# Patient Record
Sex: Male | Born: 1935 | Race: White | Hispanic: No | Smoking: Former smoker
Health system: Southern US, Community
[De-identification: ages and names within clinical notes are randomized; demographics above are authoritative.]

## PROBLEM LIST (undated history)

## (undated) DIAGNOSIS — K219 Gastro-esophageal reflux disease without esophagitis: Secondary | ICD-10-CM

## (undated) DIAGNOSIS — I10 Essential (primary) hypertension: Secondary | ICD-10-CM

## (undated) DIAGNOSIS — H9190 Unspecified hearing loss, unspecified ear: Secondary | ICD-10-CM

## (undated) HISTORY — PX: ERCP: SHX60

## (undated) HISTORY — PX: HERNIA REPAIR: SHX51

---

## 2003-03-03 HISTORY — PX: CHOLECYSTECTOMY: SHX55

## 2012-02-09 ENCOUNTER — Encounter (HOSPITAL_COMMUNITY): Payer: Self-pay | Admitting: Pharmacy Technician

## 2012-02-09 ENCOUNTER — Other Ambulatory Visit (INDEPENDENT_AMBULATORY_CARE_PROVIDER_SITE_OTHER): Payer: Self-pay | Admitting: *Deleted

## 2012-02-09 DIAGNOSIS — R109 Unspecified abdominal pain: Secondary | ICD-10-CM

## 2012-02-09 DIAGNOSIS — K8050 Calculus of bile duct without cholangitis or cholecystitis without obstruction: Secondary | ICD-10-CM

## 2012-02-10 ENCOUNTER — Encounter (HOSPITAL_COMMUNITY): Payer: Self-pay

## 2012-02-10 ENCOUNTER — Encounter (HOSPITAL_COMMUNITY)
Admission: RE | Admit: 2012-02-10 | Discharge: 2012-02-10 | Disposition: A | Discharge status: Home / Self Care | Payer: Medicare Other | Source: Ambulatory Visit | Attending: Internal Medicine | Admitting: Internal Medicine

## 2012-02-10 VITALS — BP 116/53 | HR 62 | Temp 97.8°F | Resp 18 | Ht 72.0 in | Wt 193.4 lb

## 2012-02-10 DIAGNOSIS — R109 Unspecified abdominal pain: Secondary | ICD-10-CM

## 2012-02-10 DIAGNOSIS — K8050 Calculus of bile duct without cholangitis or cholecystitis without obstruction: Secondary | ICD-10-CM

## 2012-02-10 HISTORY — DX: Essential (primary) hypertension: I10

## 2012-02-10 HISTORY — DX: Gastro-esophageal reflux disease without esophagitis: K21.9

## 2012-02-10 HISTORY — DX: Unspecified hearing loss, unspecified ear: H91.90

## 2012-02-10 LAB — BASIC METABOLIC PANEL
Chloride: 106 mEq/L (ref 96–112)
Creatinine, Ser: 1.09 mg/dL (ref 0.50–1.35)
GFR calc Af Amer: 70 mL/min — ABNORMAL LOW (ref >90)
Potassium: 4.8 mEq/L (ref 3.5–5.1)
Sodium: 141 mEq/L (ref 135–145)

## 2012-02-10 LAB — HEMOGLOBIN AND HEMATOCRIT, BLOOD
HCT: 40.5 % (ref 39.0–52.0)
Hemoglobin: 13.9 g/dL (ref 13.0–17.0)

## 2012-02-10 NOTE — Patient Instructions (Addendum)
20 John Knapp  02/10/2012   Your procedure is scheduled on: 02/12/2012  Report to [facility] at  755  AM.  Call this number if you have problems the morning of surgery: [phone]   Remember:   Do not eat food:After Midnight.  May have clear liquids:until Midnight .    Take these medicines the morning of surgery with A SIP OF WATER: benazepril,lanoxin,nexium   Do not wear jewelry, make-up or nail polish.  Do not wear lotions, powders, or perfumes.   Do not shave 48 hours prior to surgery. Men may shave face and neck.  Do not bring valuables to the hospital.  Contacts, dentures or bridgework may not be worn into surgery.  Leave suitcase in the car. After surgery it may be brought to your room.  For patients admitted to the hospital, checkout time is 11:00 AM the day of discharge.   Patients discharged the day of surgery will not be allowed to drive home.  Name and phone number of your driver: family  Special Instructions: N/A   Please read over the following fact sheets that you were given: Pain Booklet, Coughing and Deep Breathing, MRSA Information, Surgical Site Infection Prevention, Anesthesia Post-op Instructions and Care and Recovery After Surgery  Endoscopic Retrograde Cholangiopancreatography This is a test used to evaluate people with jaundice (a condition in which the person's skin is turning yellow because of the high level of bilirubin in your blood). Bilirubin is a product of the blood which is elevated when an obstruction in the bile duct occurs. The bile ducts are the pipe-like system which carry the bile from the liver to the gallbladder and out into your small bowel. In this test, a fiber optic endoscope (a small pencil-sized telescope) is inserted and a catheter is put into ducts for examination. This test can reveal stones, strictures, cysts, tumors, and other irregularities within the pancreatic ducts and bile ducts. PREPARATION FOR TEST Do not eat or drink  after midnight the day before the test.  NORMAL FINDINGS Normal size of biliary and pancreatic ducts. No obstruction or filling defects within the biliary or pancreatic ducts. Ranges for normal findings may vary among different laboratories and hospitals. You should always check with your doctor after having lab work or other tests done to discuss the meaning of your test results and whether your values are considered within normal limits. MEANING OF TEST  Your caregiver will go over the test results with you and discuss the importance and meaning of your results, as well as treatment options and the need for additional tests if necessary. OBTAINING THE TEST RESULTS  It is your responsibility to obtain your test results. Ask the lab or department performing the test when and how you will get your results. Document Released: 06/12/2004 Document Revised: 10/29/2010 Document Reviewed: 01/27/2008 [facility] Patient Information 2012 [place], [place].PATIENT INSTRUCTIONS POST-ANESTHESIA  IMMEDIATELY FOLLOWING SURGERY:  Do not drive or operate machinery for the first twenty four hours after surgery.  Do not make any important decisions for twenty four hours after surgery or while taking narcotic pain medications or sedatives.  If you develop intractable nausea and vomiting or a severe headache please notify your doctor immediately.  FOLLOW-UP:  Please make an appointment with your surgeon as instructed. You do not need to follow up with anesthesia unless specifically instructed to do so.  WOUND CARE INSTRUCTIONS (if applicable):  Keep a dry clean dressing on the anesthesia/puncture wound site if there is  drainage.  Once the wound has quit draining you may leave it open to air.  Generally you should leave the bandage intact for twenty four hours unless there is drainage.  If the epidural site drains for more than 36-48 hours please call the anesthesia department.  QUESTIONS?:  Please feel free to call your  physician or the hospital operator if you have any questions, and they will be happy to assist you.

## 2012-02-11 ENCOUNTER — Encounter (INDEPENDENT_AMBULATORY_CARE_PROVIDER_SITE_OTHER): Payer: Self-pay

## 2012-02-12 ENCOUNTER — Ambulatory Visit (HOSPITAL_COMMUNITY): Payer: Medicare Other

## 2012-02-12 ENCOUNTER — Ambulatory Visit (HOSPITAL_COMMUNITY)
Admission: RE | Admit: 2012-02-12 | Discharge: 2012-02-12 | Disposition: A | Discharge status: Home / Self Care | Payer: Medicare Other | Source: Ambulatory Visit | Attending: Internal Medicine | Admitting: Internal Medicine

## 2012-02-12 ENCOUNTER — Encounter (HOSPITAL_COMMUNITY): Payer: Self-pay | Admitting: Anesthesiology

## 2012-02-12 ENCOUNTER — Encounter (HOSPITAL_COMMUNITY): Payer: Self-pay | Admitting: *Deleted

## 2012-02-12 ENCOUNTER — Encounter (HOSPITAL_COMMUNITY)
Admission: RE | Disposition: A | Discharge status: Home / Self Care | Payer: Self-pay | Source: Ambulatory Visit | Attending: Internal Medicine

## 2012-02-12 ENCOUNTER — Ambulatory Visit (HOSPITAL_COMMUNITY): Payer: Medicare Other | Admitting: Anesthesiology

## 2012-02-12 DIAGNOSIS — R112 Nausea with vomiting, unspecified: Secondary | ICD-10-CM | POA: Insufficient documentation

## 2012-02-12 DIAGNOSIS — Z01812 Encounter for preprocedural laboratory examination: Secondary | ICD-10-CM | POA: Insufficient documentation

## 2012-02-12 DIAGNOSIS — R7402 Elevation of levels of lactic acid dehydrogenase (LDH): Secondary | ICD-10-CM

## 2012-02-12 DIAGNOSIS — K838 Other specified diseases of biliary tract: Secondary | ICD-10-CM

## 2012-02-12 DIAGNOSIS — R740 Nonspecific elevation of levels of transaminase and lactic acid dehydrogenase [LDH]: Secondary | ICD-10-CM

## 2012-02-12 DIAGNOSIS — K8050 Calculus of bile duct without cholangitis or cholecystitis without obstruction: Secondary | ICD-10-CM

## 2012-02-12 DIAGNOSIS — I10 Essential (primary) hypertension: Secondary | ICD-10-CM | POA: Insufficient documentation

## 2012-02-12 DIAGNOSIS — R109 Unspecified abdominal pain: Secondary | ICD-10-CM

## 2012-02-12 DIAGNOSIS — R1013 Epigastric pain: Secondary | ICD-10-CM | POA: Insufficient documentation

## 2012-02-12 HISTORY — PX: ERCP: SHX5425

## 2012-02-12 SURGERY — ERCP, WITH INTERVENTION IF INDICATED
Anesthesia: General | Site: Esophagus

## 2012-02-12 MED ORDER — LACTATED RINGERS IV SOLN
INTRAVENOUS | Status: DC
  Administered 2012-02-12: 09:00:00 via INTRAVENOUS

## 2012-02-12 MED ORDER — LIDOCAINE HCL (PF) 1 % IJ SOLN
INTRAMUSCULAR | Status: AC
  Filled 2012-02-12: qty 5

## 2012-02-12 MED ORDER — ONDANSETRON HCL 4 MG/2ML IJ SOLN
INTRAMUSCULAR | Status: AC
  Filled 2012-02-12: qty 2

## 2012-02-12 MED ORDER — PROPOFOL 10 MG/ML IV EMUL
INTRAVENOUS | Status: AC
  Filled 2012-02-12: qty 20

## 2012-02-12 MED ORDER — ONDANSETRON HCL 4 MG/2ML IJ SOLN
INTRAMUSCULAR | Status: DC | PRN
  Administered 2012-02-12: 4 mg via INTRAVENOUS

## 2012-02-12 MED ORDER — SUCCINYLCHOLINE CHLORIDE 20 MG/ML IJ SOLN
INTRAMUSCULAR | Status: DC | PRN
  Administered 2012-02-12: 120 mg via INTRAVENOUS

## 2012-02-12 MED ORDER — BUTAMBEN-TETRACAINE-BENZOCAINE 2-2-14 % EX AERO
1.0000 | INHALATION_SPRAY | Freq: Once | CUTANEOUS | Status: AC
  Administered 2012-02-12: 1 via TOPICAL
  Filled 2012-02-12: qty 56

## 2012-02-12 MED ORDER — LIDOCAINE HCL (CARDIAC) 20 MG/ML IV SOLN
INTRAVENOUS | Status: DC | PRN
  Administered 2012-02-12: 100 mg via INTRAVENOUS

## 2012-02-12 MED ORDER — STERILE WATER FOR IRRIGATION IR SOLN
Status: DC | PRN
  Administered 2012-02-12: 12:00:00

## 2012-02-12 MED ORDER — FENTANYL CITRATE 0.05 MG/ML IJ SOLN: 25.0000 ug | INTRAMUSCULAR | Status: DC | PRN

## 2012-02-12 MED ORDER — GLYCOPYRROLATE 0.2 MG/ML IJ SOLN
0.2000 mg | Freq: Once | INTRAMUSCULAR | Status: AC
  Administered 2012-02-12: 0.2 mg via INTRAVENOUS

## 2012-02-12 MED ORDER — ONDANSETRON HCL 4 MG/2ML IJ SOLN: 4.0000 mg | Freq: Once | INTRAMUSCULAR | Status: DC | PRN

## 2012-02-12 MED ORDER — FENTANYL CITRATE 0.05 MG/ML IJ SOLN
INTRAMUSCULAR | Status: DC | PRN
  Administered 2012-02-12: 100 ug via INTRAVENOUS

## 2012-02-12 MED ORDER — SODIUM CHLORIDE 0.9 % IV SOLN
INTRAVENOUS | Status: DC | PRN
  Administered 2012-02-12: 12:00:00

## 2012-02-12 MED ORDER — LACTATED RINGERS IV SOLN
INTRAVENOUS | Status: DC | PRN
  Administered 2012-02-12: 11:00:00 via INTRAVENOUS

## 2012-02-12 MED ORDER — ONDANSETRON HCL 4 MG/2ML IJ SOLN
4.0000 mg | Freq: Once | INTRAMUSCULAR | Status: AC
  Administered 2012-02-12: 4 mg via INTRAVENOUS

## 2012-02-12 MED ORDER — GLUCAGON HCL (RDNA) 1 MG IJ SOLR
INTRAMUSCULAR | Status: DC | PRN
  Administered 2012-02-12: .25 mL via INTRAVENOUS
  Administered 2012-02-12: .75 mL via INTRAVENOUS

## 2012-02-12 MED ORDER — PROPOFOL 10 MG/ML IV BOLUS
INTRAVENOUS | Status: DC | PRN
  Administered 2012-02-12: 150 mg via INTRAVENOUS

## 2012-02-12 MED ORDER — FENTANYL CITRATE 0.05 MG/ML IJ SOLN
INTRAMUSCULAR | Status: AC
  Filled 2012-02-12: qty 2

## 2012-02-12 MED ORDER — SUCCINYLCHOLINE CHLORIDE 20 MG/ML IJ SOLN
INTRAMUSCULAR | Status: AC
  Filled 2012-02-12: qty 1

## 2012-02-12 MED ORDER — CEFAZOLIN SODIUM-DEXTROSE 2-3 GM-% IV SOLR
INTRAVENOUS | Status: AC
  Filled 2012-02-12: qty 50

## 2012-02-12 MED ORDER — CEFAZOLIN SODIUM-DEXTROSE 2-3 GM-% IV SOLR
2.0000 g | INTRAVENOUS | Status: AC
  Administered 2012-02-12: 2 g via INTRAVENOUS

## 2012-02-12 MED ORDER — MIDAZOLAM HCL 2 MG/2ML IJ SOLN
INTRAMUSCULAR | Status: AC
  Filled 2012-02-12: qty 2

## 2012-02-12 MED ORDER — URSODIOL 250 MG PO TABS: 250.0000 mg | ORAL_TABLET | Freq: Two times a day (BID) | ORAL | Status: DC

## 2012-02-12 MED ORDER — MIDAZOLAM HCL 2 MG/2ML IJ SOLN
1.0000 mg | INTRAMUSCULAR | Status: DC | PRN
  Administered 2012-02-12: 2 mg via INTRAVENOUS

## 2012-02-12 MED ORDER — GLYCOPYRROLATE 0.2 MG/ML IJ SOLN
INTRAMUSCULAR | Status: AC
  Filled 2012-02-12: qty 1

## 2012-02-12 SURGICAL SUPPLY — 24 items
BAG HAMPER (MISCELLANEOUS) ×3 IMPLANT
BALLN RETRIEVAL 12X15 (BALLOONS) ×3 IMPLANT
BASKET TRAPEZOID 3X6 (MISCELLANEOUS) ×3 IMPLANT
DEVICE INFLATION ENCORE 26 (MISCELLANEOUS) IMPLANT
DEVICE LOCKING W-BIOPSY CAP (MISCELLANEOUS) ×3 IMPLANT
GUIDEWIRE HYDRA JAGWIRE .35 (WIRE) IMPLANT
GUIDEWIRE JAG HINI 025X260CM (WIRE) IMPLANT
KIT ROOM TURNOVER APOR (KITS) ×3 IMPLANT
LUBRICANT JELLY 4.5OZ STERILE (MISCELLANEOUS) ×3 IMPLANT
NEEDLE HYPO 18GX1.5 BLUNT FILL (NEEDLE) IMPLANT
PAD ARMBOARD 7.5X6 YLW CONV (MISCELLANEOUS) ×6 IMPLANT
PATHFINDER 450CM 0.18 (STENTS) IMPLANT
POSITIONER HEAD 8X9X4 ADT (SOFTGOODS) IMPLANT
SNARE ROTATE MED OVAL 20MM (MISCELLANEOUS) IMPLANT
SPHINCTEROTOME AUTOTOME .25 (MISCELLANEOUS) ×6 IMPLANT
SPHINCTEROTOME HYDRATOME 44 (MISCELLANEOUS) ×3 IMPLANT
SPONGE GAUZE 4X4 12PLY (GAUZE/BANDAGES/DRESSINGS) ×3 IMPLANT
SYR 3ML LL SCALE MARK (SYRINGE) IMPLANT
SYR 50ML LL SCALE MARK (SYRINGE) ×6 IMPLANT
SYSTEM CONTINUOUS INJECTION (MISCELLANEOUS) ×3 IMPLANT
TUBING ENDO SMARTCAP PENTAX (MISCELLANEOUS) ×3 IMPLANT
WALLSTENT METAL COVERED 10X60 (STENTS) IMPLANT
WALLSTENT METAL COVERED 10X80 (STENTS) IMPLANT
WATER STERILE IRR 1000ML POUR (IV SOLUTION) ×6 IMPLANT

## 2012-02-12 NOTE — Brief Op Note (Signed)
02/12/2012  12:24 PM  PATIENT:  John Knapp  78 y.o. male  PRE-OPERATIVE DIAGNOSIS:  abdominal pain, choledocholithiasis  POST-OPERATIVE DIAGNOSIS:  Dilated Biliary System, No Stones  PROCEDURE:  Procedure(s) (LRB) with comments: ENDOSCOPIC RETROGRADE CHOLANGIOPANCREATOGRAPHY (ERCP) (N/A)  SURGEON:  Surgeon(s) and Role:    * Malissa Hippo, MD - Primary  Findings; Normal ampulla of Vater. Markedly dilated biliary system but no definite stones identified. CHD measured close to 20 mm. Sphincterotomy extended. Dormia basket and balloon stone extractor trolled through the bile duct no stones present. Patient tolerated procedure well.

## 2012-02-12 NOTE — Anesthesia Preprocedure Evaluation (Addendum)
Anesthesia Evaluation  Patient identified by MRN, date of birth, ID band Patient awake    Reviewed: Allergy & Precautions, H&P , NPO status , Patient's Chart, lab work & pertinent test results  Airway Mallampati: III  Neck ROM: Limited  Mouth opening: Limited Mouth Opening  Dental  (+) Teeth Intact and Dental Advisory Given   Pulmonary neg pulmonary ROS,    Pulmonary exam normal       Cardiovascular hypertension, Pt. on medications Rhythm:Irregular Rate:Normal     Neuro/Psych negative neurological ROS  negative psych ROS   GI/Hepatic Neg liver ROS, GERD-  Medicated and Controlled,  Endo/Other  negative endocrine ROS  Renal/GU negative Renal ROS  negative genitourinary   Musculoskeletal negative musculoskeletal ROS (+)   Abdominal Normal abdominal exam  (+)   Peds  Hematology negative hematology ROS (+)   Anesthesia Other Findings   Reproductive/Obstetrics                          Anesthesia Physical Anesthesia Plan  ASA: II  Anesthesia Plan: General   Post-op Pain Management:    Induction: Intravenous  Airway Management Planned: Video Laryngoscope Planned and Oral ETT  Additional Equipment:   Intra-op Plan:   Post-operative Plan: Extubation in OR  Informed Consent: I have reviewed the patients History and Physical, chart, labs and discussed the procedure including the risks, benefits and alternatives for the proposed anesthesia with the patient or authorized representative who has indicated his/her understanding and acceptance.     Plan Discussed with: CRNA  Anesthesia Plan Comments:         Anesthesia Quick Evaluation

## 2012-02-12 NOTE — H&P (Signed)
John Knapp is an 78 y.o. male.   Chief Complaint: Patient is here for ERCP. HPI: Patient is 78 year old Caucasian male who presents with 8 week history of intermittent epigastric pain nausea and vomiting. He was evaluated by his gastroenterologist Dr. Halina Maidens of [place], [place]. He underwent EGD which was negative for peptic ulcer disease. He's also undergone imaging studies. His LFTs were noted to be elevated. Has history of choledocholithiasis and had ERCP for a 5 years ago. Given his symptoms and the fact that he has elevated LFTs Dr. Aleene Davidson felt that he has recurrent choledocholithiasis and therefore referred him. Patient is accompanied by his wife today. He states he's been on very strict diet. He has not experienced any pain for the last 10 days. He has lost 10 pounds since her symptoms started. He denies fever or chills hematemesis melena or rectal bleeding. In addition to what is recorded under past medical history he has had multiple skin cancers removed from his face. He also has history of fatty liver. He has lost several pounds since his diagnosis was made.  Past Medical History  Diagnosis Date  . Hypertension   . GERD (gastroesophageal reflux disease)   . HOH (hard of hearing)     Past Surgical History  Procedure Date  . Hernia repair     bilateral inguinal hernia  . Cholecystectomy 2005    [facility]  . Ercp     History reviewed. No pertinent family history. Social History:  reports that he has quit smoking. His smoking use included Pipe and Cigars. He quit smokeless tobacco use about 18 years ago. He reports that he drinks about .6 ounces of alcohol per week. He reports that he does not use illicit drugs.  Allergies: No Known Allergies  Medications Prior to Admission  Medication Sig Dispense Refill  . benazepril (LOTENSIN) 20 MG tablet Take 20 mg by mouth daily.      . cholecalciferol (VITAMIN D) 1000 UNITS tablet Take 1,000 Units by mouth daily.       . digoxin (LANOXIN) 0.25 MG tablet Take 0.25 mg by mouth daily.      Marland Kitchen esomeprazole (NEXIUM) 40 MG capsule Take 40 mg by mouth daily.      . furosemide (LASIX) 20 MG tablet Take 20 mg by mouth every other day.      . Glucosamine-Chondroitin (GLUCOSAMINE CHONDROITIN COMPLX) 500-250 MG CAPS Take 1 capsule by mouth 3 (three) times daily.      . saw palmetto 500 MG capsule Take 500 mg by mouth 3 (three) times daily.      Marland Kitchen acetaminophen-codeine (TYLENOL #3) 300-30 MG per tablet Take 1 tablet by mouth every 4 (four) hours as needed. Pain      . dicyclomine (BENTYL) 20 MG tablet Take 20 mg by mouth daily as needed. Stomach pain      . sildenafil (VIAGRA) 50 MG tablet Take 50 mg by mouth daily as needed. Sexual Arousal        Results for orders placed during the hospital encounter of 02/10/12 (from the past 48 hour(s))  BASIC METABOLIC PANEL     Status: Abnormal   Collection Time   02/10/12  1:30 PM      Component Value Range Comment   Sodium 141  135 - 145 mEq/L    Potassium 4.8  3.5 - 5.1 mEq/L    Chloride 106  96 - 112 mEq/L    CO2 28  19 - 32 mEq/L  Glucose, Bld 101 (*) 70 - 99 mg/dL    BUN 13  6 - 23 mg/dL    Creatinine, Ser 6.96  0.50 - 1.35 mg/dL    Calcium 29.5  8.4 - 10.5 mg/dL    GFR calc non Af Amer 60 (*) >90 mL/min    GFR calc Af Amer 70 (*) >90 mL/min   HEMOGLOBIN AND HEMATOCRIT, BLOOD     Status: Normal   Collection Time   02/10/12  1:30 PM      Component Value Range Comment   Hemoglobin 13.9  13.0 - 17.0 g/dL    HCT 28.4  13.2 - 44.0 %    No results found.  ROS  Blood pressure 112/60, pulse 61, temperature 97.6 F (36.4 C), temperature source Oral, resp. rate 17, SpO2 93.00%. Physical Exam  Constitutional: He appears well-developed and well-nourished.  HENT:  Mouth/Throat: Oropharynx is clear and moist.  Eyes: Conjunctivae normal are normal. No scleral icterus.  Neck: No thyromegaly present.  Cardiovascular: Normal rate, regular rhythm and normal heart sounds.    No murmur heard. Respiratory: Effort normal and breath sounds normal.  GI: Soft. He exhibits no distension and no mass. There is no tenderness.  Musculoskeletal: He exhibits no edema.  Lymphadenopathy:    He has no cervical adenopathy.  Neurological: He is alert.  Skin: Skin is warm and dry.     Assessment/Plan Recurrent upper abdominal pain with elevated transaminases and history of choledocholithiasis. Suspect recurrent choledocholithiasis. ERCP with therapeutic intervention to  John Knapp U 02/12/2012, 10:50 AM

## 2012-02-12 NOTE — Preoperative (Signed)
Beta Blockers   Reason not to administer Beta Blockers:Not Applicable 

## 2012-02-12 NOTE — Addendum Note (Signed)
Addendum  created 02/12/12 1402 by Jeani Hawking, CRNA   Modules edited:Charges VN

## 2012-02-12 NOTE — Transfer of Care (Signed)
Immediate Anesthesia Transfer of Care Note  Patient: John Knapp  Procedure(s) Performed: Procedure(s) (LRB) with comments: ENDOSCOPIC RETROGRADE CHOLANGIOPANCREATOGRAPHY (ERCP) (N/A)  Patient Location: PACU  Anesthesia Type:General  Level of Consciousness: awake, alert  and oriented  Airway & Oxygen Therapy: Patient Spontanous Breathing and Patient connected to face mask oxygen  Post-op Assessment: Report given to PACU RN and Post -op Vital signs reviewed and stable  Post vital signs: Reviewed and stable  Complications: No apparent anesthesia complications

## 2012-02-12 NOTE — Anesthesia Postprocedure Evaluation (Signed)
  Anesthesia Post-op Note  Patient: John Knapp  Procedure(s) Performed: Procedure(s) (LRB) with comments: ENDOSCOPIC RETROGRADE CHOLANGIOPANCREATOGRAPHY (ERCP) (N/A)  Patient Location: PACU  Anesthesia Type:General  Level of Consciousness: awake, alert  and oriented  Airway and Oxygen Therapy: Patient Spontanous Breathing  Post-op Pain: none  Post-op Assessment: Post-op Vital signs reviewed, Patient's Cardiovascular Status Stable, Respiratory Function Stable, Patent Airway, No signs of Nausea or vomiting, Adequate PO intake, Pain level controlled, No headache, No backache, No residual numbness and No residual motor weakness  Post-op Vital Signs: Reviewed and stable  Complications: No apparent anesthesia complications

## 2012-02-13 NOTE — Op Note (Signed)
NAME:  John Knapp, KRAAI NO.:  [id number]  MEDICAL RECORD NO.:  [id number]  LOCATION:  [facility]                          FACILITY:  [facility]  PHYSICIAN:  Lionel December, M.D.    DATE OF BIRTH:  03/26/1925  DATE OF PROCEDURE:  02/12/2012 DATE OF DISCHARGE:  02/12/2012                              OPERATIVE REPORT   PROCEDURE:  ERCP with extension of biliary sphincterotomy.  INDICATION:  The patient is an 78 year old Caucasian male with recurrent epigastric pain, nausea, vomiting, with some elevated transaminases.  He has history of choledocholithiasis and has undergone one or possibly two ERCPs in the past.  His workup recently has been negative for peptic ulcer disease or other etiologies.  He was evaluated by Dr. Halina Maidens of [place], [place], and sent over for therapeutic ERCP.  Procedures were reviewed with the patient and his wife, and informed consent was obtained from him.  SEDATION/ANESTHESIA:  Please see anesthesia records for details.  FINDINGS:  Procedure was performed in the OR.  After general endotracheal anesthesia was induced, the patient was placed in semiprone position and therapeutic Pentax video duodenoscope was passed through oropharynx without any difficulty into the stomach and across the pylorus into bulb and descending duodenum.  Ampulla of Vater appeared to be normal.  CBD was easily cannulated using RX 44 Autotome and 0.035 Hydra Jagwire.  Biliary system was filled with dilute contrast.  CBD and CHD were markedly dilated and somewhat tortuous.  CHD was about 20 mm. No filling defects were identified.  Biliary sphincterotomy was extended.  Initially, Dormia basket was trolled through the common hepatic and bile duct couple of times, but there were no stones. Subsequently, stone balloon extractor was also passed through the bile duct with similar results.  I was able to pass 11-mm balloon across the sphincterotomy.  Pancreatic duct was  not studied.  The patient tolerated the procedure well.  FINAL DIAGNOSIS:  Markedly dilated biliary system without evidence of choledocholithiasis.  Suspect he may have microlithiasis and passing them intermittently.  Biliary sphincterotomy extended.  RECOMMENDATIONS:  Clear liquids today.  No aspirin or NSAIDs for 72 hours.  We will start him on Urso 250 mg p.o. b.i.d.  Should he experience another episodes, we will check his LFTs and go from there.  I would like to see him in the office in 3 months.          ______________________________ Lionel December, M.D.     NR/MEDQ  D:  02/12/2012  T:  02/13/2012  Job:  [id number]  cc:   Ree Kida B. Lorelle Gibbs., MD

## 2012-02-16 ENCOUNTER — Encounter (HOSPITAL_COMMUNITY): Payer: Self-pay | Admitting: Internal Medicine

## 2012-05-10 ENCOUNTER — Encounter (INDEPENDENT_AMBULATORY_CARE_PROVIDER_SITE_OTHER): Payer: Self-pay | Admitting: Internal Medicine

## 2012-05-10 ENCOUNTER — Ambulatory Visit (INDEPENDENT_AMBULATORY_CARE_PROVIDER_SITE_OTHER): Payer: Medicare Other | Admitting: Internal Medicine

## 2012-05-10 VITALS — BP 114/66 | HR 70 | Temp 97.0°F | Resp 18 | Ht 72.0 in | Wt 201.7 lb

## 2012-05-10 DIAGNOSIS — K8050 Calculus of bile duct without cholangitis or cholecystitis without obstruction: Secondary | ICD-10-CM | POA: Insufficient documentation

## 2012-05-10 DIAGNOSIS — I10 Essential (primary) hypertension: Secondary | ICD-10-CM | POA: Insufficient documentation

## 2012-05-10 NOTE — Patient Instructions (Signed)
Notify if you have right upper quadrant abdominal pain. Have Dr. Jonelle Sports check  LFTs with next blood draw and fax [place] a copy.

## 2012-05-10 NOTE — Progress Notes (Signed)
Presenting complaint;  Followup for biliary tract disease.  Subjective:  Patient is 79 year old Caucasian male who has history of choledocholithiasis with ERCP several years ago who presented with epigastric pain nausea elevated transaminases and biliary dilation. He underwent ERCP three months ago revealing dilated very system but no evidence of choledocholithiasis. Sphincterotomy was extended and he was begun on Urso. He he feels well. He has not had epigastric or right upper quadrant pain since his last examination. He remains with good appetite. He has prescription for dicyclomine which she uses sporadically when he has low abdominal pain. He has good appetite and his weight is stable. He is more concerned about his wife's headache and neck pain and he states he refuses to see a doctor.  Current Medications: Current Outpatient Prescriptions  Medication Sig Dispense Refill  . acetaminophen-codeine (TYLENOL #3) 300-30 MG per tablet Take 1 tablet by mouth every 4 (four) hours as needed. Pain      . benazepril (LOTENSIN) 20 MG tablet Take 20 mg by mouth daily.      . cholecalciferol (VITAMIN D) 1000 UNITS tablet Take 1,000 Units by mouth daily.      Marland Kitchen dicyclomine (BENTYL) 10 MG capsule Take 20 mg by mouth as needed.      . digoxin (LANOXIN) 0.25 MG tablet Take 0.25 mg by mouth daily.      Marland Kitchen esomeprazole (NEXIUM) 40 MG capsule Take 40 mg by mouth daily.      . fish oil-omega-3 fatty acids 1000 MG capsule Take 1 g by mouth. Patient states that he taked 1 by mouth every other day      . furosemide (LASIX) 20 MG tablet Take 20 mg by mouth daily.       . Glucosamine-Chondroitin (GLUCOSAMINE CHONDROITIN COMPLX) 500-250 MG CAPS Take 1 capsule by mouth 3 (three) times daily.      . Multiple Vitamins-Minerals (CENTRUM SILVER PO) Take by mouth daily.      . saw palmetto 500 MG capsule Take 500 mg by mouth 3 (three) times daily.      . sildenafil (VIAGRA) 50 MG tablet Take 50 mg by mouth daily as  needed. Sexual Arousal      . ursodiol (URSO) 250 MG tablet Take 1 tablet (250 mg total) by mouth 2 (two) times daily.  60 tablet  5   No current facility-administered medications for this visit.     Objective: Blood pressure 114/66, pulse 70, temperature 97 F (36.1 C), temperature source Oral, resp. rate 18, height 6' (1.829 m), weight 201 lb 11.2 oz (91.491 kg). Patient is alert and in no acute distress. Conjunctiva is pink. Sclera is nonicteric Oropharyngeal mucosa is normal. No neck masses or thyromegaly noted. Abdomen is soft and nontender without organomegaly or masses.  No LE edema or clubbing noted.   Assessment:  Recurrent biliary colic most likely secondary to microlithiasis. He has history of choledocholithiasis with remote ERCP. Recent ERCP three months ago revealed dilated biliary system without evidence of stones. Sphincterotomy was extended. He remains asymptomatic.   Plan:  Patient will continue Urso at current dose which is 250 mg by mouth twice a day. He will have LFTs on his next visit with Dr. Alita Chyle. Patient will call if abdominal pain recurs. Office visit in one year.

## 2012-09-22 ENCOUNTER — Other Ambulatory Visit (INDEPENDENT_AMBULATORY_CARE_PROVIDER_SITE_OTHER): Payer: Self-pay | Admitting: Internal Medicine

## 2012-09-22 MED ORDER — URSODIOL 250 MG PO TABS: 250.0000 mg | ORAL_TABLET | Freq: Two times a day (BID) | ORAL | Status: DC

## 2013-02-15 ENCOUNTER — Encounter (INDEPENDENT_AMBULATORY_CARE_PROVIDER_SITE_OTHER): Payer: Self-pay | Admitting: *Deleted

## 2013-03-06 IMAGING — CR DG ABD PORTABLE 1V
1 series · 1 of 1 positions shown · non-contrast
Comparison: None.

CLINICAL DATA: Choledocholithiasis.  Abdominal pain.

PORTABLE ABDOMEN - 1 VIEW

[view not recorded]
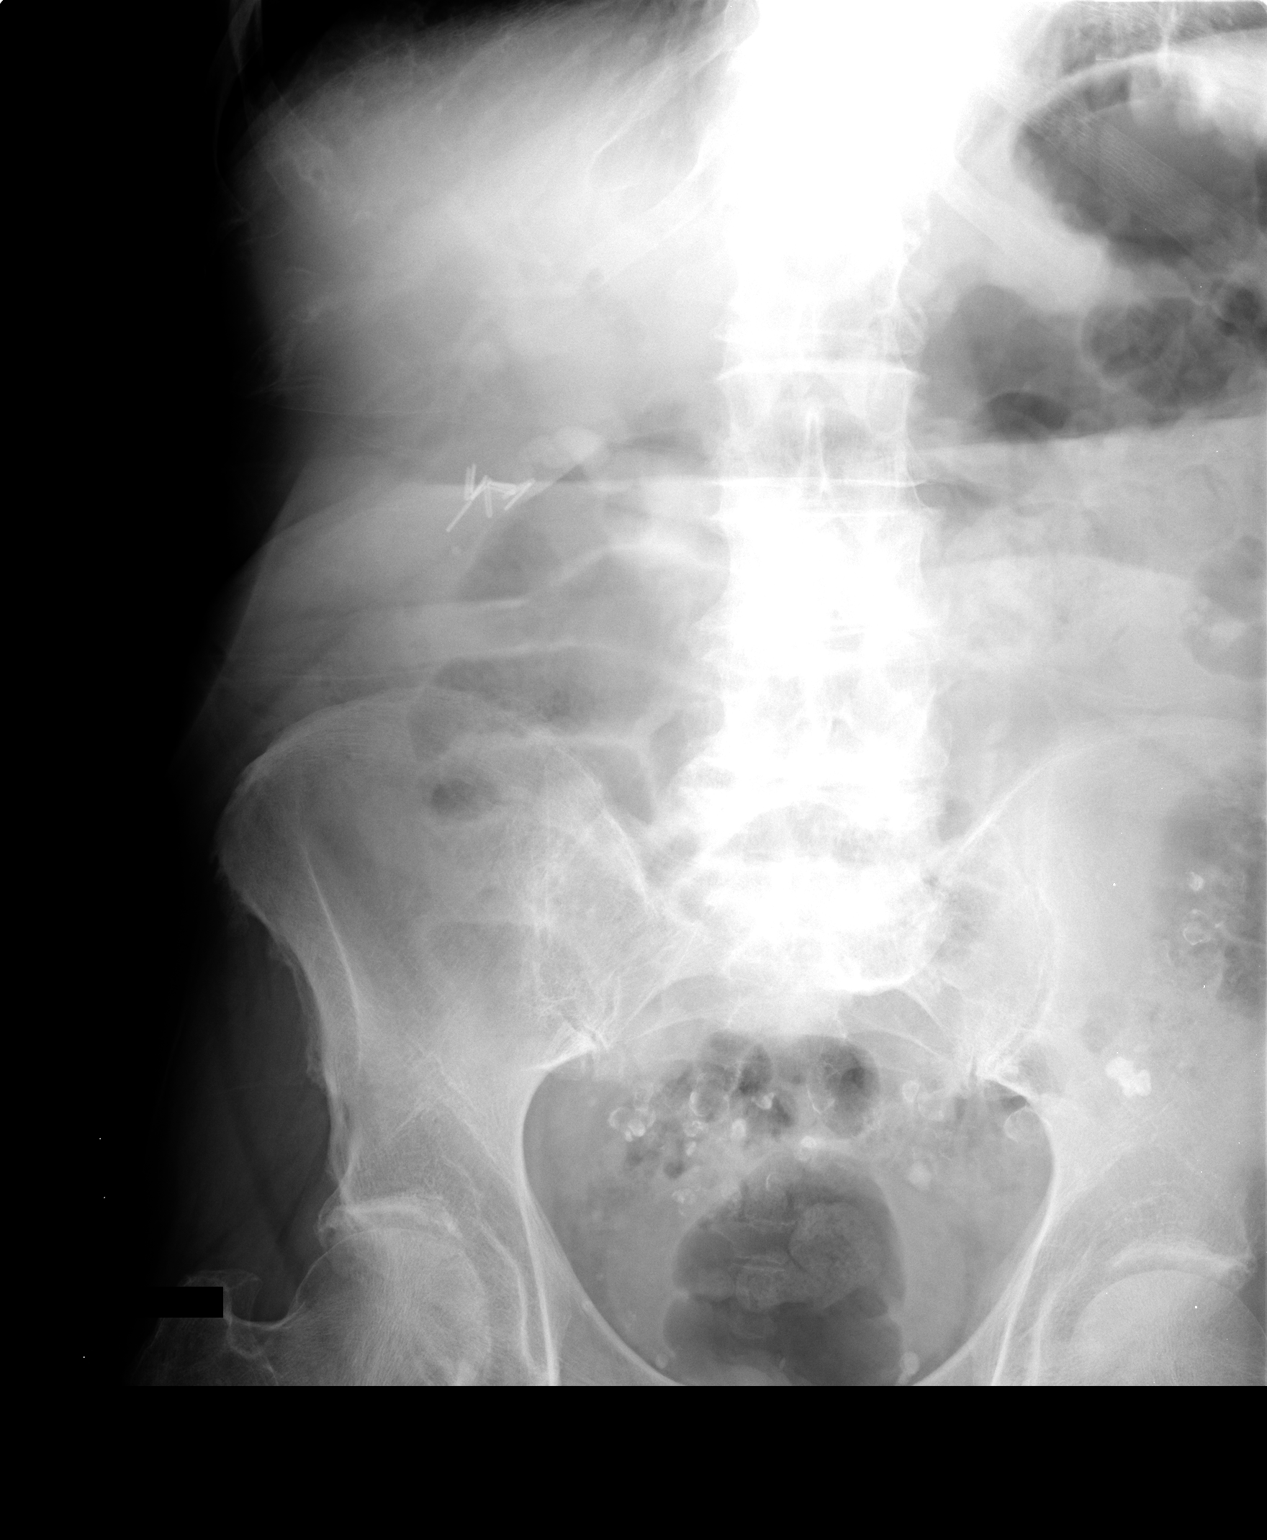

[1 of 1 positions shown; findings below may reference images not displayed]

FINDINGS: There is faint contrast in the slightly dilated biliary
tree.  Gallbladder has been removed.  No dilated bowel.

Barium is seen in multiple diverticula in the distal colon.
Degenerative changes throughout the lumbar spine.

No acute abnormalities.
IMPRESSION: Contrast seen in the dilated biliary tree.  Sigmoid diverticulosis.

## 2013-05-16 ENCOUNTER — Ambulatory Visit (INDEPENDENT_AMBULATORY_CARE_PROVIDER_SITE_OTHER): Payer: Medicare Other | Admitting: Internal Medicine

## 2013-05-16 ENCOUNTER — Encounter (INDEPENDENT_AMBULATORY_CARE_PROVIDER_SITE_OTHER): Payer: Self-pay | Admitting: Internal Medicine

## 2013-05-16 VITALS — BP 112/64 | HR 64 | Temp 97.7°F | Ht 72.0 in | Wt 204.6 lb

## 2013-05-16 DIAGNOSIS — R748 Abnormal levels of other serum enzymes: Secondary | ICD-10-CM

## 2013-05-16 NOTE — Progress Notes (Addendum)
Subjective:     Patient ID: John Knapp, male   DOB: 1925/08/01, 80 y.o.   MRN: [id number]  HPI Here today for one year follow up. In 2013 he underwent an ERCP for dilated CBD. He had presented with epigastric pain, nausea and elevated transaminases. There was no evidence of choledocholithiasis. He was begun on Urso. He was referred to our office by Dr. Halina Maidens of [place], [place]. He had undergone and EGD which was negative for PUD.  He tells me he is doing great. He is exercising by walking up and down stairs.   Appetite is good. No weight loss. There is not abdominal pain.  He has a BM twice a day. No melena or bright red rectal bleeding PCP is Dr. Burr Medico   ERCP in 01/2012 FINAL DIAGNOSIS: Markedly dilated biliary system without evidence of  choledocholithiasis.  Suspect he may have microlithiasis and passing them intermittently.  Biliary sphincterotomy extended.  RECOMMENDATIONS: Clear liquids today. No aspirin or NSAIDs for 72  hours.  04/2012    Review of Systems     Past Medical History  Diagnosis Date  . Hypertension   . GERD (gastroesophageal reflux disease)   . HOH (hard of hearing)     Past Surgical History  Procedure Laterality Date  . Hernia repair      bilateral inguinal hernia  . Cholecystectomy  2005    [facility]  . Ercp    . Ercp  02/12/2012    Procedure: ENDOSCOPIC RETROGRADE CHOLANGIOPANCREATOGRAPHY (ERCP);  Surgeon: Malissa Hippo, MD;  Location: [facility];  Service: Endoscopy;  Laterality: N/A;    No Known Allergies  Current Outpatient Prescriptions on File Prior to Visit  Medication Sig Dispense Refill  . acetaminophen-codeine (TYLENOL #3) 300-30 MG per tablet Take 1 tablet by mouth every 4 (four) hours as needed. Pain      . benazepril (LOTENSIN) 20 MG tablet Take 20 mg by mouth daily.      . cholecalciferol (VITAMIN D) 1000 UNITS tablet Take 1,000 Units by mouth daily.      Marland Kitchen dicyclomine (BENTYL) 10 MG capsule Take 20 mg by  mouth as needed.      . digoxin (LANOXIN) 0.25 MG tablet Take 0.25 mg by mouth daily.      Marland Kitchen esomeprazole (NEXIUM) 40 MG capsule Take 40 mg by mouth daily.      . fish oil-omega-3 fatty acids 1000 MG capsule Take 1 g by mouth. Patient states that he taked 1 by mouth every other day      . furosemide (LASIX) 20 MG tablet Take 20 mg by mouth daily.       . Glucosamine-Chondroitin (GLUCOSAMINE CHONDROITIN COMPLX) 500-250 MG CAPS Take 1 capsule by mouth 3 (three) times daily.      . Multiple Vitamins-Minerals (CENTRUM SILVER PO) Take by mouth daily.      . saw palmetto 500 MG capsule Take 500 mg by mouth 3 (three) times daily.      . sildenafil (VIAGRA) 50 MG tablet Take 50 mg by mouth daily as needed. Sexual Arousal      . ursodiol (URSO) 250 MG tablet Take 1 tablet (250 mg total) by mouth 2 (two) times daily.  60 tablet  5   No current facility-administered medications on file prior to visit.     Objective:   Physical Exam Filed Vitals:   05/16/13 1354  BP: 112/64  Pulse: 64  Temp: 97.7 F (36.5 C)  Height: 6' (  1.829 m)  Weight: 204 lb 9.6 oz (92.806 kg)   Alert and oriented. Skin warm and dry. Oral mucosa is moist.   . Sclera anicteric, conjunctivae is pink. Thyroid not enlarged. No cervical lymphadenopathy. Lungs clear. Heart regular rate and rhythm.  Abdomen is soft. Bowel sounds are positive. No hepatomegaly. No abdominal masses felt. No tenderness.  No edema to lower extremities      Assessment:    Recurrent biliary colic most likely secondary to microlithiasis. He has history of choledocholithiasis with remote ERCP    ERCP 01/2012:FINAL DIAGNOSIS: Markedly dilated biliary system without evidence of  choledocholithiasis.  He remains asymptomatic. Hepatic profile. OV in 1 yr.         Plan:    Hepatic profile. OV in 1 year.

## 2013-05-16 NOTE — Patient Instructions (Signed)
OV in 1 yr. Hepatic function today

## 2013-05-17 ENCOUNTER — Other Ambulatory Visit (INDEPENDENT_AMBULATORY_CARE_PROVIDER_SITE_OTHER): Payer: Self-pay | Admitting: Internal Medicine

## 2013-05-17 LAB — HEPATIC FUNCTION PANEL
ALBUMIN: 4.3 g/dL (ref 3.5–5.2)
ALK PHOS: 57 U/L (ref 39–117)
ALT: 12 U/L (ref 0–53)
AST: 16 U/L (ref 0–37)
Bilirubin, Direct: 0.1 mg/dL (ref 0.0–0.3)
Indirect Bilirubin: 0.4 mg/dL (ref 0.2–1.2)
TOTAL PROTEIN: 6.6 g/dL (ref 6.0–8.3)
Total Bilirubin: 0.5 mg/dL (ref 0.2–1.2)

## 2013-05-18 ENCOUNTER — Other Ambulatory Visit (INDEPENDENT_AMBULATORY_CARE_PROVIDER_SITE_OTHER): Payer: Self-pay | Admitting: Internal Medicine

## 2013-05-18 DIAGNOSIS — K838 Other specified diseases of biliary tract: Secondary | ICD-10-CM

## 2013-05-18 MED ORDER — URSODIOL 250 MG PO TABS: 250.0000 mg | ORAL_TABLET | Freq: Two times a day (BID) | ORAL | Status: DC

## 2014-02-15 ENCOUNTER — Encounter (INDEPENDENT_AMBULATORY_CARE_PROVIDER_SITE_OTHER): Payer: Self-pay

## 2014-02-21 ENCOUNTER — Encounter (INDEPENDENT_AMBULATORY_CARE_PROVIDER_SITE_OTHER): Payer: Self-pay | Admitting: *Deleted

## 2014-07-10 ENCOUNTER — Ambulatory Visit (INDEPENDENT_AMBULATORY_CARE_PROVIDER_SITE_OTHER): Payer: Medicare Other | Admitting: Internal Medicine

## 2014-10-08 ENCOUNTER — Encounter (INDEPENDENT_AMBULATORY_CARE_PROVIDER_SITE_OTHER): Payer: Self-pay | Admitting: *Deleted

## 2014-10-22 ENCOUNTER — Telehealth (INDEPENDENT_AMBULATORY_CARE_PROVIDER_SITE_OTHER): Payer: Self-pay | Admitting: *Deleted

## 2014-10-22 NOTE — Telephone Encounter (Signed)
Talked with the patient, and he states that he has stopped this medication, he stopped in February. He had a history of allergy to this medication , it caused itching. Patient states that he would scratch until he bleed. This recent episode, after taking the medication , the itching has stopped.  I told patient that I would contact [facility] and would let Dr.Rehman know.

## 2014-10-22 NOTE — Telephone Encounter (Signed)
[facility] is wanting DrSekouKarilyn Cota to call [facility] to stop the refill on Ursodiol. [place] I do not see where we have sent a Rx to this pharmacy and that he would need to call them or his PCP to see if they did the refill. Jheremy doesn't understand and said this was a Psychologist, educational and he can not call. Repeated everything to him and also said I would advised Dr. Karilyn Cota just in case.

## 2014-10-24 NOTE — Telephone Encounter (Signed)
[facility] was called,spoke with [facility]. The prescription was discontinued.

## 2014-10-24 NOTE — Telephone Encounter (Signed)
Tammy, please call patient's pharmacy to cancel this prescription. This medication was started at the time of ERCP. Please also let patient know after you talk with her pharmacy

## 2014-10-24 NOTE — Telephone Encounter (Signed)
Kathie Rhodes, wife, called back with [facility] phone number. The number is [phone].

## 2014-10-24 NOTE — Telephone Encounter (Signed)
Prescription was canceled at [facility] on Monday. I will also call [facility] on behalf of the patient. He is going to call back with the number he wants me to call.

## 2014-11-06 ENCOUNTER — Ambulatory Visit (INDEPENDENT_AMBULATORY_CARE_PROVIDER_SITE_OTHER): Payer: Medicare Other | Admitting: Internal Medicine

## 2014-12-04 ENCOUNTER — Ambulatory Visit (INDEPENDENT_AMBULATORY_CARE_PROVIDER_SITE_OTHER): Payer: Medicare Other | Admitting: Internal Medicine

## 2014-12-04 ENCOUNTER — Encounter (INDEPENDENT_AMBULATORY_CARE_PROVIDER_SITE_OTHER): Payer: Self-pay | Admitting: Internal Medicine

## 2014-12-04 VITALS — BP 128/70 | HR 62 | Temp 97.4°F | Resp 18 | Ht 72.0 in | Wt 200.0 lb

## 2014-12-04 DIAGNOSIS — R1010 Upper abdominal pain, unspecified: Secondary | ICD-10-CM | POA: Diagnosis not present

## 2014-12-04 NOTE — Patient Instructions (Addendum)
Physician will call with results of blood test when completed. 

## 2014-12-04 NOTE — Progress Notes (Signed)
Presenting complaint;  History of biliary colic.  Subjective:  Patient is 81 year old Caucasian male who is here for scheduled visit. He was last seen in March 2015. He has history of biliary colic felt to be secondary to microlithiasis. He underwent ERCP in December 2013 with extension of sphincterotomy but no stones were identified. He was begun on Urso. Patient states that he developed progressive itching. He was finally able to associated with Delma Freeze which he stopped several months ago. He states it took him 3-4 months before itching resolve 100%. He remains with good appetite. Eyes right upper quadrant abdominal pain. About 4 weeks ago here in episode of lower abdominal pain relieved with single dose of Bentyl. He does not take this medication frequently. He has not had any side effects. About 2 weeks ago he had an episode of upper abdominal pain without nausea vomiting fever or chills. He took Maalox and pain resolved. Last week he had an episode of chest pain and he took aspirin and pain resolved. He did not experience shortness of breath lightheadedness or diaphoresis. He cited not to go to emergency room. He has good appetite. His bowels move daily. He denies melena or rectal bleeding. He has an appointment to see Dr. Jonelle Sports later this week.    Current Medications: Outpatient Encounter Prescriptions as of 12/04/2014  Medication Sig  . alum & mag hydroxide-simeth (MAALOX/MYLANTA) 200-200-20 MG/5ML suspension Take by mouth. Patient states that he takes this when he has pain above the belly.  Marland Kitchen amitriptyline (ELAVIL) 10 MG tablet Take 10 mg by mouth at bedtime.  Marland Kitchen aspirin 81 MG tablet Take 81 mg by mouth. Patient states that he takes when he has chest pain.  . cholecalciferol (VITAMIN D) 1000 UNITS tablet Take 1,000 Units by mouth daily.  . clobetasol cream (TEMOVATE) 0.05 % Apply 1 application topically 2 (two) times daily.  Marland Kitchen dicyclomine (BENTYL) 10 MG capsule Take 20 mg by mouth as  needed.  . diltiazem (CARDIZEM) 60 MG tablet Take 60 mg by mouth daily.   Marland Kitchen esomeprazole (NEXIUM) 40 MG capsule Take 40 mg by mouth daily.  . fish oil-omega-3 fatty acids 1000 MG capsule Take 1 g by mouth. Patient states that he taked 1 by mouth every other day  . Glucosamine-Chondroitin (GLUCOSAMINE CHONDROITIN COMPLX) 500-250 MG CAPS Take 1 capsule by mouth 3 (three) times daily.  . Multiple Vitamins-Minerals (CENTRUM SILVER PO) Take by mouth daily.  Marland Kitchen OVER THE COUNTER MEDICATION Healthy prostate. 3 tabs a day  . sildenafil (VIAGRA) 50 MG tablet Take 50 mg by mouth daily as needed. Sexual Arousal  . [DISCONTINUED] acetaminophen-codeine (TYLENOL #3) 300-30 MG per tablet Take 1 tablet by mouth every 4 (four) hours as needed. Pain  . [DISCONTINUED] benazepril (LOTENSIN) 20 MG tablet Take 20 mg by mouth daily.  . [DISCONTINUED] digoxin (LANOXIN) 0.25 MG tablet Take 0.25 mg by mouth daily.  . [DISCONTINUED] furosemide (LASIX) 20 MG tablet Take 20 mg by mouth daily.   . [DISCONTINUED] saw palmetto 500 MG capsule Take 500 mg by mouth 3 (three) times daily.  . [DISCONTINUED] ursodiol (URSO) 250 MG tablet Take 1 tablet (250 mg total) by mouth 2 (two) times daily. (Patient not taking: Reported on 12/04/2014)   No facility-administered encounter medications on file as of 12/04/2014.     Objective: Blood pressure 128/70, pulse 62, temperature 97.4 F (36.3 C), temperature source Oral, resp. rate 18, height 6' (1.829 m), weight 200 lb (90.719 kg). He shouldn't is alert and  in no acute distress. HEENT patient with than stated age. Conjunctiva is pink. Sclera is nonicteric Oropharyngeal mucosa is normal. No neck masses or thyromegaly noted. Cardiac exam with regular rhythm normal S1 and S2. No murmur or gallop noted. Lungs are clear to auscultation. Abdomen is full but soft and nontender without organomegaly or masses.  No LE edema or clubbing noted.  Labs/studies Results: LFTs from 05/16/2013   Bilirubin 0.5, AP 57, AST 16, ALT 12, total protein 6.6 and albumin of 4.3.    Assessment:  #1. History of biliary colic possibly second to microlithiasis. Status post ERCP with extension of sphincterotomy in December 2013. Patient intolerant of Urso because of itching. #2. GERD. Heartburn is well controlled with therapy.  Plan:  LFTs later this week after he is been seen by his PCP. Patient advised to Dr. Jonelle Sports that he had an episode of chest pain. He'll have LFTs again within 12 hours off upper abdominal or chest pain. Office visit in one year unless he has abnormal LFTs are biliary colic recurs.

## 2015-09-05 ENCOUNTER — Encounter (INDEPENDENT_AMBULATORY_CARE_PROVIDER_SITE_OTHER): Payer: Self-pay | Admitting: Internal Medicine

## 2015-09-05 ENCOUNTER — Ambulatory Visit (INDEPENDENT_AMBULATORY_CARE_PROVIDER_SITE_OTHER): Payer: Medicare Other | Admitting: Internal Medicine

## 2015-09-05 VITALS — BP 130/50 | HR 64 | Temp 98.1°F | Ht 73.0 in | Wt 210.1 lb

## 2015-09-05 DIAGNOSIS — K8050 Calculus of bile duct without cholangitis or cholecystitis without obstruction: Secondary | ICD-10-CM

## 2015-09-05 NOTE — Progress Notes (Addendum)
Subjective:    Patient ID: John Knapp, male    DOB: 13-Mar-1925, 82 y.o.   MRN: [id number]  HPI Presents today with c/o that 6 or 7 months ago he had dysphagia. He says he is not having any dysphagia biw,  He was last seen in October by Dr Laural Golden, He has not had any abdominal pain. His appetite has remained good. He has gained 10 pounds since his last visit. Rarely has acid reflux and has not been taking his Nexium. He has a BM x 1 a day and sometimes twice a day. No melena or BRRB.  Hx of ERCP with extension of bilary sphincterotomy in 2015 for recurrent epigastric pain, nausea, vomiting with elevated transaminases. Hx of choledocholithiasis.  Transaminases in 2015 were normal.      Hepatic Function Latest Ref Rng 05/16/2013  Total Protein 6.0 - 8.3 g/dL 6.6  Albumin 3.5 - 5.2 g/dL 4.3  AST 0 - 37 U/L 16  ALT 0 - 53 U/L 12  Alk Phosphatase 39 - 117 U/L 57  Total Bilirubin 0.2 - 1.2 mg/dL 0.5  Bilirubin, Direct 0.0 - 0.3 mg/dL 0.1       02/04/2012 EGD: esophagitis. Dr. West Carbo Esophageal ring dilated with 52 and 56 Maloney dilator. Small antral polyp. Otherwise normal EGD.     02/12/2012 ERCP with extension of biliary sphincterotomy.  INDICATION: The patient is an 82 year old Caucasian male with recurrent epigastric pain, nausea, vomiting, with some elevated transaminases. He has history of choledocholithiasis and has undergone one or possibly two ERCP's in the past. His workup recently has been negative for peptic ulcer disease or other etiologies. He was evaluated by Dr. Drue Stager of [place], [place], and sent over for therapeutic ERCP.  FINAL DIAGNOSIS: Markedly dilated biliary system without evidence of choledocholithiasis.     Review of Systems Past Medical History  Diagnosis Date  . Hypertension   . GERD (gastroesophageal reflux disease)   . HOH (hard of hearing)     Past Surgical History  Procedure Laterality Date  . Hernia  repair      bilateral inguinal hernia  . Cholecystectomy  2005    [facility]  . Ercp    . Ercp  02/12/2012    Procedure: ENDOSCOPIC RETROGRADE CHOLANGIOPANCREATOGRAPHY (ERCP);  Surgeon: Rogene Houston, MD;  Location: [facility];  Service: Endoscopy;  Laterality: N/A;    Allergies  Allergen Reactions  . Ursodiol Itching    Current Outpatient Prescriptions on File Prior to Visit  Medication Sig Dispense Refill  . amitriptyline (ELAVIL) 10 MG tablet Take 10 mg by mouth at bedtime.    Marland Kitchen aspirin 81 MG tablet Take 81 mg by mouth. Patient states that he takes when he has chest pain.    . cholecalciferol (VITAMIN D) 1000 UNITS tablet Take 1,000 Units by mouth daily.    Marland Kitchen dicyclomine (BENTYL) 10 MG capsule Take 20 mg by mouth as needed.    . diltiazem (CARDIZEM) 60 MG tablet Take 60 mg by mouth daily.     . fish oil-omega-3 fatty acids 1000 MG capsule Take 1 g by mouth. Patient states that he taked 1 by mouth every other day    . Glucosamine-Chondroitin (GLUCOSAMINE CHONDROITIN COMPLX) 500-250 MG CAPS Take 1 capsule by mouth daily.     . Multiple Vitamins-Minerals (CENTRUM SILVER PO) Take by mouth daily.    . sildenafil (VIAGRA) 50 MG tablet Take 50 mg by mouth daily as needed. Sexual Arousal    .  esomeprazole (NEXIUM) 40 MG capsule Take 40 mg by mouth daily. Reported on 09/05/2015     No current facility-administered medications on file prior to visit.        Objective:   Physical ExamBlood pressure 130/50, pulse 64, temperature 98.1 F (36.7 C), height 6' 1"  (1.854 m), weight 210 lb 1.6 oz (95.301 kg).  Alert and oriented. Skin warm and dry. Oral mucosa is moist. Teeth in good condition.    Sclera anicteric, conjunctivae is pink. Thyroid not enlarged. No cervical lymphadenopathy. Lungs clear. Heart regular rate and rhythm.  Abdomen is soft. Bowel sounds are positive. No hepatomegaly. No abdominal masses felt. No tenderness.  No edema to lower extremities.  (Compression hose in place). Walks  with a cane.        Assessment & Plan:    History of biliary colic possibly second to microlithiasis. Status post ERCP with extension of sphincterotomy in December 2013. Patient intolerant of Urso because of itching.  If he has abdominal pain, will repeat LFT's.    GERD.  Presently not taking Nexium.  Hepatic function today.  OV in one year.

## 2015-09-05 NOTE — Patient Instructions (Signed)
Labs today. OV in 1 y ear.  

## 2015-09-06 LAB — HEPATIC FUNCTION PANEL
ALBUMIN: 3.7 g/dL (ref 3.6–5.1)
ALK PHOS: 65 U/L (ref 40–115)
ALT: 7 U/L — ABNORMAL LOW (ref 9–46)
AST: 13 U/L (ref 10–35)
BILIRUBIN TOTAL: 0.4 mg/dL (ref 0.2–1.2)
Bilirubin, Direct: 0.1 mg/dL (ref <=0.2)
Indirect Bilirubin: 0.3 mg/dL (ref 0.2–1.2)
TOTAL PROTEIN: 6 g/dL — AB (ref 6.1–8.1)

## 2015-09-11 ENCOUNTER — Encounter (INDEPENDENT_AMBULATORY_CARE_PROVIDER_SITE_OTHER): Payer: Self-pay | Admitting: Internal Medicine

## 2015-12-03 ENCOUNTER — Encounter (INDEPENDENT_AMBULATORY_CARE_PROVIDER_SITE_OTHER): Payer: Self-pay | Admitting: Internal Medicine

## 2015-12-03 ENCOUNTER — Ambulatory Visit (INDEPENDENT_AMBULATORY_CARE_PROVIDER_SITE_OTHER): Payer: Medicare Other | Admitting: Internal Medicine

## 2015-12-03 VITALS — BP 110/70 | HR 66 | Temp 97.0°F | Resp 18 | Ht 73.0 in | Wt 213.3 lb

## 2015-12-03 DIAGNOSIS — K219 Gastro-esophageal reflux disease without esophagitis: Secondary | ICD-10-CM

## 2015-12-03 DIAGNOSIS — Z8719 Personal history of other diseases of the digestive system: Secondary | ICD-10-CM

## 2015-12-03 NOTE — Progress Notes (Signed)
Presenting complaint;  History of choledocholithiasis. GERD.  Database and Subjective:  John Knapp is a 82 year old Caucasian male with history of choledocholithiasis over 12 years ago. He presented with biliary symptoms and elevated transaminases back in December 2013 when he had ERCP revealing dilated biliary system. Sphincterotomy was extended. It was felt his symptoms are due to microlithiasis. He has done well since then. He was seen in the office about 2 months ago complaining of dysphagia but at the time of office visit he was much better. He was advised to go back on Nexium. Patient is accompanied by his wife today. He states his memory is not good. He does not remember ERCP that he had in 2013. 6-8 weeks ago he had an episode of nausea vomiting and sharp epigastric pain but as soon as he vomited he felt better. He has not experienced right subcostal scapular or shoulder pain. He feels heartburn is well controlled with Nexium and he is not having any swallowing difficulty. His appetite is good. His weight is up by 3 pounds. His bowels move twice daily.    Current Medications: Outpatient Encounter Prescriptions as of 12/03/2015  Medication Sig  . aspirin 81 MG tablet Take 81 mg by mouth. Patient states that he takes when he has chest pain.  . cholecalciferol (VITAMIN D) 1000 UNITS tablet Take 1,000 Units by mouth daily.  Marland Kitchen. esomeprazole (NEXIUM) 40 MG capsule Take 40 mg by mouth daily. Reported on 09/05/2015  . fish oil-omega-3 fatty acids 1000 MG capsule Take 1 g by mouth. Patient states that he taked 1 by mouth every other day  . folic acid (FOLVITE) 1 MG tablet Take 1 mg by mouth 2 (two) times daily.  Marland Kitchen. gabapentin (NEURONTIN) 300 MG capsule 300 mg. Patient takes 1 by mouth in the morning and 2 by mouth at night.  . Glucosamine-Chondroitin (GLUCOSAMINE CHONDROITIN COMPLX) 500-250 MG CAPS Take 1 capsule by mouth daily.   Marland Kitchen. METOPROLOL TARTRATE PO Take 25 mg by mouth 2 (two) times daily.  .  Multiple Vitamins-Minerals (CENTRUM SILVER PO) Take by mouth daily.  Marland Kitchen. OVER THE COUNTER MEDICATION Healthy prostate Supplement - patient states that he takes 2 a day and sometime 3 per day.  . sildenafil (VIAGRA) 50 MG tablet Take 50 mg by mouth daily as needed. Sexual Arousal  . vitamin B-12 (CYANOCOBALAMIN) 1000 MCG tablet Take 1,000 mcg by mouth daily.  . [DISCONTINUED] diltiazem (CARDIZEM) 60 MG tablet Take 60 mg by mouth daily.   . [DISCONTINUED] amitriptyline (ELAVIL) 10 MG tablet Take 10 mg by mouth at bedtime.  . [DISCONTINUED] dicyclomine (BENTYL) 10 MG capsule Take 20 mg by mouth as needed.   No facility-administered encounter medications on file as of 12/03/2015.      Objective: Blood pressure 110/70, pulse 66, temperature 97 F (36.1 C), temperature source Oral, resp. rate 18, height 6\' 1"  (1.854 m), weight 213 lb 4.8 oz (96.8 kg). Patient is alert and in no acute distress. Conjunctiva is pink. Sclera is nonicteric Oropharyngeal mucosa is normal. No neck masses or thyromegaly noted. Cardiac exam with regular rhythm normal S1 and S2. Faint systolic ejection murmur best heard at left sternal border. Lungs are clear to auscultation. Abdomen is full but soft and nontender without organomegaly or masses. No LE edema or clubbing noted.  Labs/studies Results: LFTs from 09/05/2015  Bilirubin 0.4, AP 65, AST 13, ALT 7 and albumin 3.7.   Assessment:  #1. History of choledocholithiasis. LFTs remain normal. Single episode of nausea vomiting  and abdominal pain possibly due to gastroenteritis or food poisoning. He is presently asymptomatic. #2. Chronic GERD. Prior EGDs negative for Barrett's esophagus. No indication for EGD since symptoms are well controlled and he does not have dysphagia.   Plan:  Patient will have LFTs within 6-12 wires of epigastric or right upper quadrant pain. He can get blood work done at Dr. [employer] office which would be very convenient. Patient will  continue esomeprazole at current dose. Unless he has problems and return for office visit in one year.

## 2015-12-03 NOTE — Patient Instructions (Addendum)
Blood work/LFTs to be done within 6-12 hours of upper abdominal pain.

## 2016-08-20 ENCOUNTER — Encounter (INDEPENDENT_AMBULATORY_CARE_PROVIDER_SITE_OTHER): Payer: Self-pay | Admitting: Internal Medicine

## 2016-08-20 ENCOUNTER — Encounter (INDEPENDENT_AMBULATORY_CARE_PROVIDER_SITE_OTHER): Payer: Self-pay

## 2016-09-07 ENCOUNTER — Ambulatory Visit (INDEPENDENT_AMBULATORY_CARE_PROVIDER_SITE_OTHER): Payer: Medicare Other | Admitting: Internal Medicine

## 2016-10-29 ENCOUNTER — Encounter (INDEPENDENT_AMBULATORY_CARE_PROVIDER_SITE_OTHER): Payer: Self-pay

## 2016-10-29 ENCOUNTER — Encounter (INDEPENDENT_AMBULATORY_CARE_PROVIDER_SITE_OTHER): Payer: Self-pay | Admitting: Internal Medicine

## 2016-11-28 ENCOUNTER — Encounter (INDEPENDENT_AMBULATORY_CARE_PROVIDER_SITE_OTHER): Payer: Self-pay | Admitting: Internal Medicine

## 2017-01-05 ENCOUNTER — Ambulatory Visit (INDEPENDENT_AMBULATORY_CARE_PROVIDER_SITE_OTHER): Payer: Medicare Other | Admitting: Internal Medicine

## 2017-04-20 ENCOUNTER — Encounter (INDEPENDENT_AMBULATORY_CARE_PROVIDER_SITE_OTHER): Payer: Self-pay | Admitting: Internal Medicine

## 2017-04-20 ENCOUNTER — Ambulatory Visit (INDEPENDENT_AMBULATORY_CARE_PROVIDER_SITE_OTHER): Payer: Medicare Other | Admitting: Internal Medicine

## 2017-04-20 VITALS — BP 120/70 | HR 64 | Temp 97.8°F | Resp 18 | Ht 73.0 in | Wt 214.2 lb

## 2017-04-20 DIAGNOSIS — Z8719 Personal history of other diseases of the digestive system: Secondary | ICD-10-CM

## 2017-04-20 NOTE — Patient Instructions (Addendum)
Notify if you have upper abdominal pain nausea or vomiting. Will request copy of blood work from Dr. Clemencia CourseHurtado's office.

## 2017-04-21 NOTE — Progress Notes (Signed)
Presenting complaint;  History of choledocholithiasis/biliary colic.  Database and subjective:  Patient is 84 year old Caucasian male who has history of choledocholithiasis.  He was first discovered to have common duct stones about 14 years ago.  He had biliary colic and elevated transaminases and a dilated biliary duct in December 2013 when he underwent ERCP.  No stones were identified.  It was felt he had microlithiasis or passing small stones.  Sphincterotomy was extended.  He has not had any problems since then. He was last seen in October 2017.  Today he is accompanied by his daughter Elease Hashimoto.  He is now staying at assisted living.  He moved to this facility in August 2018.  He likes this facility.  He is able to do dually exercise.  He has very good appetite.  He denies nausea vomiting or abdominal pain.  His bowels move daily. He he has not lost any weight since his last visit. His daughter states he gets blood work through Dr. Clemencia Course office.  She is not sure if he is getting LFTs.  Current Medications: Outpatient Encounter Medications as of 04/20/2017  Medication Sig  . aspirin 81 MG tablet Take 81 mg by mouth daily. Patient states that he takes when he has chest pain.   . cholecalciferol (VITAMIN D) 1000 UNITS tablet Take 2,000 Units by mouth daily.   Marland Kitchen donepezil (ARICEPT) 10 MG tablet Take 10 mg by mouth daily.  . Glucosamine-Chondroitin (GLUCOSAMINE CHONDROITIN COMPLX) 500-250 MG CAPS Take 4 capsules by mouth daily.   . Melatonin 3 MG CAPS Take 3 mg by mouth at bedtime.  . Multiple Vitamins-Minerals (CENTRUM SILVER PO) Take by mouth daily.  . OXcarbazepine ER (OXTELLAR XR) 600 MG TB24 Take 600 mg by mouth 2 (two) times daily.  . sertraline (ZOLOFT) 100 MG tablet Take 100 mg by mouth daily.  . vitamin B-12 (CYANOCOBALAMIN) 1000 MCG tablet Take 1,000 mcg by mouth daily.  . [DISCONTINUED] esomeprazole (NEXIUM) 40 MG capsule Take 40 mg by mouth daily. Reported on 09/05/2015  .  [DISCONTINUED] fish oil-omega-3 fatty acids 1000 MG capsule Take 1 g by mouth. Patient states that he taked 1 by mouth every other day  . [DISCONTINUED] folic acid (FOLVITE) 1 MG tablet Take 1 mg by mouth 2 (two) times daily.  . [DISCONTINUED] gabapentin (NEURONTIN) 300 MG capsule 300 mg. Patient takes 1 by mouth in the morning and 2 by mouth at night.  . [DISCONTINUED] METOPROLOL TARTRATE PO Take 25 mg by mouth 2 (two) times daily.  . [DISCONTINUED] OVER THE COUNTER MEDICATION Healthy prostate Supplement - patient states that he takes 2 a day and sometime 3 per day.  . [DISCONTINUED] sildenafil (VIAGRA) 50 MG tablet Take 50 mg by mouth daily as needed. Sexual Arousal   No facility-administered encounter medications on file as of 04/20/2017.      Objective: Blood pressure 120/70, pulse 64, temperature 97.8 F (36.6 C), temperature source Oral, resp. rate 18, height 6\' 1"  (1.854 m), weight 214 lb 3.2 oz (97.2 kg). Patient is alert and in no acute distress. Conjunctiva is pink. Sclera is nonicteric Oropharyngeal mucosa is normal. No neck masses or thyromegaly noted. Cardiac exam with regular rhythm normal S1 and S2. No murmur or gallop noted. Lungs are clear to auscultation. Abdomen abdomen is full but soft and nontender without organomegaly or masses. He has trace edema around ankles.  Labs/studies Results:  No recent LFTs available.  Assessment:  #1.  History of choledocholithiasis.  Last ERCP with extension  of sphincterotomy was in December 2013.  He has not had biliary type of pain.  Recent LFTs are not available but transaminases back in July 2017 were normal.  Unless he has relapse of his symptoms no further workup needed.  Plan:  Will request copy of blood work from Dr. Clemencia CourseHurtado's office to see if LFTs done otherwise he can have it by PCP sometime this year. Patient will call if he has nausea vomiting or upper abdominal pain otherwise return for office visit in 2 years per his  request).

## 2021-05-31 DEATH — deceased

## 2022-01-11 ENCOUNTER — Encounter (INDEPENDENT_AMBULATORY_CARE_PROVIDER_SITE_OTHER): Payer: Self-pay | Admitting: Gastroenterology
